# Patient Record
Sex: Female | Born: 1995 | Race: Black or African American | Hispanic: No | Marital: Single | Smoking: Current some day smoker
Health system: Southern US, Community
[De-identification: ages and names within clinical notes are randomized; demographics above are authoritative.]

---

## 2016-04-03 ENCOUNTER — Encounter: Payer: Self-pay | Admitting: Emergency Medicine

## 2016-04-03 ENCOUNTER — Emergency Department
Admission: EM | Admit: 2016-04-03 | Discharge: 2016-04-03 | Disposition: A | Discharge status: Home / Self Care | Payer: Self-pay | Attending: Emergency Medicine | Admitting: Emergency Medicine

## 2016-04-03 ENCOUNTER — Emergency Department: Payer: Self-pay

## 2016-04-03 DIAGNOSIS — F17200 Nicotine dependence, unspecified, uncomplicated: Secondary | ICD-10-CM | POA: Insufficient documentation

## 2016-04-03 DIAGNOSIS — N83201 Unspecified ovarian cyst, right side: Secondary | ICD-10-CM

## 2016-04-03 DIAGNOSIS — N730 Acute parametritis and pelvic cellulitis: Secondary | ICD-10-CM

## 2016-04-03 DIAGNOSIS — R102 Pelvic and perineal pain: Secondary | ICD-10-CM

## 2016-04-03 DIAGNOSIS — N739 Female pelvic inflammatory disease, unspecified: Secondary | ICD-10-CM | POA: Insufficient documentation

## 2016-04-03 LAB — CBC
HEMATOCRIT: 39.4 % (ref 35.0–47.0)
Hemoglobin: 13.7 g/dL (ref 12.0–16.0)
MCH: 27.5 pg (ref 26.0–34.0)
MCHC: 34.7 g/dL (ref 32.0–36.0)
MCV: 79.4 fL — AB (ref 80.0–100.0)
Platelets: 219 10*3/uL (ref 150–440)
RBC: 4.96 MIL/uL (ref 3.80–5.20)
RDW: 14.2 % (ref 11.5–14.5)
WBC: 8.3 10*3/uL (ref 3.6–11.0)

## 2016-04-03 LAB — WET PREP, GENITAL
CLUE CELLS WET PREP: NONE SEEN
SPERM: NONE SEEN
Yeast Wet Prep HPF POC: NONE SEEN

## 2016-04-03 LAB — COMPREHENSIVE METABOLIC PANEL
ALBUMIN: 4 g/dL (ref 3.5–5.0)
ALT: 11 U/L — ABNORMAL LOW (ref 14–54)
ANION GAP: 3 — AB (ref 5–15)
AST: 18 U/L (ref 15–41)
Alkaline Phosphatase: 44 U/L (ref 38–126)
BILIRUBIN TOTAL: 0.4 mg/dL (ref 0.3–1.2)
BUN: 9 mg/dL (ref 6–20)
CALCIUM: 9 mg/dL (ref 8.9–10.3)
CO2: 30 mmol/L (ref 22–32)
Chloride: 105 mmol/L (ref 101–111)
Creatinine, Ser: 0.87 mg/dL (ref 0.44–1.00)
Glucose, Bld: 101 mg/dL — ABNORMAL HIGH (ref 65–99)
POTASSIUM: 4 mmol/L (ref 3.5–5.1)
Sodium: 138 mmol/L (ref 135–145)
TOTAL PROTEIN: 6.7 g/dL (ref 6.5–8.1)

## 2016-04-03 LAB — URINALYSIS, COMPLETE (UACMP) WITH MICROSCOPIC
BILIRUBIN URINE: NEGATIVE
Bacteria, UA: NONE SEEN
GLUCOSE, UA: NEGATIVE mg/dL
Hgb urine dipstick: NEGATIVE
KETONES UR: NEGATIVE mg/dL
NITRITE: NEGATIVE
PH: 6 (ref 5.0–8.0)
Protein, ur: NEGATIVE mg/dL
SPECIFIC GRAVITY, URINE: 1.014 (ref 1.005–1.030)

## 2016-04-03 LAB — CHLAMYDIA/NGC RT PCR (ARMC ONLY)
CHLAMYDIA TR: DETECTED — AB
N GONORRHOEAE: NOT DETECTED

## 2016-04-03 LAB — POCT PREGNANCY, URINE: Preg Test, Ur: NEGATIVE

## 2016-04-03 LAB — LIPASE, BLOOD: Lipase: 20 U/L (ref 11–51)

## 2016-04-03 MED ORDER — SODIUM CHLORIDE 0.9 % IV BOLUS (SEPSIS)
1000.0000 mL | Freq: Once | INTRAVENOUS | Status: AC
  Administered 2016-04-03: 1000 mL via INTRAVENOUS

## 2016-04-03 MED ORDER — ONDANSETRON HCL 4 MG/2ML IJ SOLN
INTRAMUSCULAR | Status: AC
  Filled 2016-04-03: qty 2

## 2016-04-03 MED ORDER — CEFTRIAXONE SODIUM 250 MG IJ SOLR
250.0000 mg | Freq: Once | INTRAMUSCULAR | Status: AC
  Administered 2016-04-03: 250 mg via INTRAMUSCULAR

## 2016-04-03 MED ORDER — DOXYCYCLINE HYCLATE 100 MG PO TABS
100.0000 mg | ORAL_TABLET | Freq: Once | ORAL | Status: AC
  Administered 2016-04-03: 100 mg via ORAL
  Filled 2016-04-03: qty 1

## 2016-04-03 MED ORDER — METRONIDAZOLE 500 MG PO TABS
ORAL_TABLET | ORAL | Status: AC
  Administered 2016-04-03: 500 mg via ORAL
  Filled 2016-04-03: qty 1

## 2016-04-03 MED ORDER — DOXYCYCLINE HYCLATE 100 MG PO TABS: 100.0000 mg | ORAL_TABLET | Freq: Two times a day (BID) | ORAL | 0 refills | Status: AC

## 2016-04-03 MED ORDER — ONDANSETRON HCL 4 MG/2ML IJ SOLN
4.0000 mg | Freq: Once | INTRAMUSCULAR | Status: AC
  Administered 2016-04-03: 4 mg via INTRAVENOUS

## 2016-04-03 MED ORDER — MORPHINE SULFATE (PF) 4 MG/ML IV SOLN
INTRAVENOUS | Status: AC
  Filled 2016-04-03: qty 1

## 2016-04-03 MED ORDER — HYDROCODONE-ACETAMINOPHEN 5-325 MG PO TABS: 1.0000 | ORAL_TABLET | ORAL | 0 refills | Status: AC | PRN

## 2016-04-03 MED ORDER — DOXYCYCLINE HYCLATE 100 MG PO TABS
ORAL_TABLET | ORAL | Status: AC
  Administered 2016-04-03: 100 mg via ORAL
  Filled 2016-04-03: qty 1

## 2016-04-03 MED ORDER — METRONIDAZOLE 500 MG PO TABS
500.0000 mg | ORAL_TABLET | Freq: Once | ORAL | Status: AC
  Administered 2016-04-03: 500 mg via ORAL

## 2016-04-03 MED ORDER — METRONIDAZOLE 500 MG PO TABS: 500.0000 mg | ORAL_TABLET | Freq: Two times a day (BID) | ORAL | 0 refills | Status: AC

## 2016-04-03 MED ORDER — CEFTRIAXONE SODIUM 250 MG IJ SOLR
INTRAMUSCULAR | Status: AC
  Administered 2016-04-03: 250 mg via INTRAMUSCULAR
  Filled 2016-04-03: qty 250

## 2016-04-03 MED ORDER — MORPHINE SULFATE (PF) 4 MG/ML IV SOLN
4.0000 mg | Freq: Once | INTRAVENOUS | Status: AC
  Administered 2016-04-03: 4 mg via INTRAVENOUS

## 2016-04-03 NOTE — ED Triage Notes (Signed)
Pt reports pelvic pain on the right side, denies any vag bleeding.

## 2016-04-03 NOTE — ED Provider Notes (Signed)
[facility] Emergency Department Provider Note  Time seen: 9:17 PM  I have reviewed the triage vital signs and the nursing notes.   HISTORY  Chief Complaint Pelvic Pain    HPI Sonya Ortega is a 20 y.o. female presents to the emergency department for 2-3 days of right pelvic pain/right lower quadrant pain. Patient states for the past 2-3 days she has been having intermittent pain now more constant. Describes as moderate at 7/10 pain. Denies vaginal bleeding or discharge. States her last period was November 7. Denies nausea, vomiting, diarrhea. Denies fever.  History reviewed. No pertinent past medical history.  There are no active problems to display for this patient.   History reviewed. No pertinent surgical history.  Prior to Admission medications   Not on File    No Known Allergies  No family history on file.  Social History Social History  Substance Use Topics  . Smoking status: Current Some Day Smoker  . Smokeless tobacco: Never Used  . Alcohol use No    Review of Systems Constitutional: Negative for fever. Cardiovascular: Negative for chest pain. Respiratory: Negative for shortness of breath. Gastrointestinal:Positive for right lower quadrant/right pelvic pain. Genitourinary: Negative for dysuria. Negative for vaginal bleeding or discharge. Neurological: Negative for headache 10-point ROS otherwise negative.  ____________________________________________   PHYSICAL EXAM:  VITAL SIGNS: ED Triage Vitals  Enc Vitals Group     BP 04/03/16 1809 121/69     Pulse Rate 04/03/16 1809 (!) 102     Resp 04/03/16 1809 15     Temp 04/03/16 1809 99.1 F (37.3 C)     Temp Source 04/03/16 1809 Oral     SpO2 04/03/16 1809 97 %     Weight 04/03/16 1758 140 lb (63.5 kg)     Height 04/03/16 1758 5\' 5"  (1.651 m)     Head Circumference --      Peak Flow --      Pain Score 04/03/16 1758 8     Pain Loc --      Pain Edu? --      Excl. in GC? --      Constitutional: Alert and oriented. Well appearing and in no distress. Eyes: Normal exam ENT   Head: Normocephalic and atraumatic   Mouth/Throat: Mucous membranes are moist. Cardiovascular: Normal rate, regular rhythm. No murmurs, rubs, or gallops. Respiratory: Normal respiratory effort without tachypnea nor retractions. Breath sounds are clear Gastrointestinal: Soft, moderate right lower quadrant/right pelvic tenderness to palpation. No rebound or guarding. No distention. Musculoskeletal: Nontender with normal range of motion in all extremities.  Neurologic:  Normal speech and language. No gross focal neurologic deficits  Skin:  Skin is warm, dry and intact.  Psychiatric: Mood and affect are normal.   ____________________________________________     RADIOLOGY  Ultrasound negative for torsion, possible right hemorrhagic cyst.  ____________________________________________   INITIAL IMPRESSION / ASSESSMENT AND PLAN / ED COURSE  Pertinent labs & imaging results that were available during my care of the patient were reviewed by me and considered in my medical decision making (see chart for details).  Patient presents for right lower quadrant/right pelvic pain. Labs are positive for trichomoniasis. Otherwise largely within normal limits. Urine (negative. Pelvic examination shows a mild amount of vaginal discharge with significant right adnexal tenderness to palpation. Mild cervical motion tenderness to palpation. Ultrasound positive for right-sided hemorrhagic cyst. I suspect the patient's pain is likely due to a right hemorrhagic cyst however given the patient's adnexal tenderness  with mild cervical motion tenderness and trichomoniasis positive wet prep we will cover with doxycycline and Rocephin for PID as well as Flagyl for trichomoniasis.  ____________________________________________   FINAL CLINICAL IMPRESSION(S) / ED DIAGNOSES  Pelvic inflammatory disease Right  hemorrhagic cyst    Minna AntisKevin Neoma Uhrich, MD 04/03/16 2121

## 2016-04-03 NOTE — Discharge Instructions (Signed)
Please take your antibiotics as prescribed and her entire course. Please take pain medication as needed, as written. Please follow-up your primary care doctor in 2-3 days for recheck/reevaluation. Return to the emergency department for any acute worsening of pain, vomiting unable to keep down her antibiotics, or any other symptom personally concerning to yourself.

## 2016-04-04 ENCOUNTER — Telehealth: Payer: Self-pay | Admitting: Emergency Medicine

## 2016-04-04 NOTE — Telephone Encounter (Signed)
Called patient to inform of positive chlamydia test. She was treated in the ED, but I wanted to explain partner treatment and free std clinic at [facility].  The number did not work.  Will send letter.

## 2016-04-18 NOTE — Telephone Encounter (Signed)
Patient called me back I response to letter.  I told her that she was treated, but if she has been exposed since then, she should go to [facility] std clinic to be treated again.

## 2018-07-12 IMAGING — US US PELVIS COMPLETE
1 series · 13 of 25 positions shown · non-contrast
Comparison: None.

CLINICAL DATA: Right pelvic pain for 1 day

EXAM:
TRANSABDOMINAL AND TRANSVAGINAL ULTRASOUND OF PELVIS
DOPPLER ULTRASOUND OF OVARIES
TECHNIQUE: Both transabdominal and transvaginal ultrasound examinations of the
pelvis were performed. Transabdominal technique was performed for
global imaging of the pelvis including uterus, ovaries, adnexal
regions, and pelvic cul-de-sac.
It was necessary to proceed with endovaginal exam following the
transabdominal exam to visualize the right ovary. Color and duplex
Doppler ultrasound was utilized to evaluate blood flow to the
ovaries.

[Series 1: us pelvis complete · 0.22mm/px · 13 of 96 slices shown]
[im 1/96]
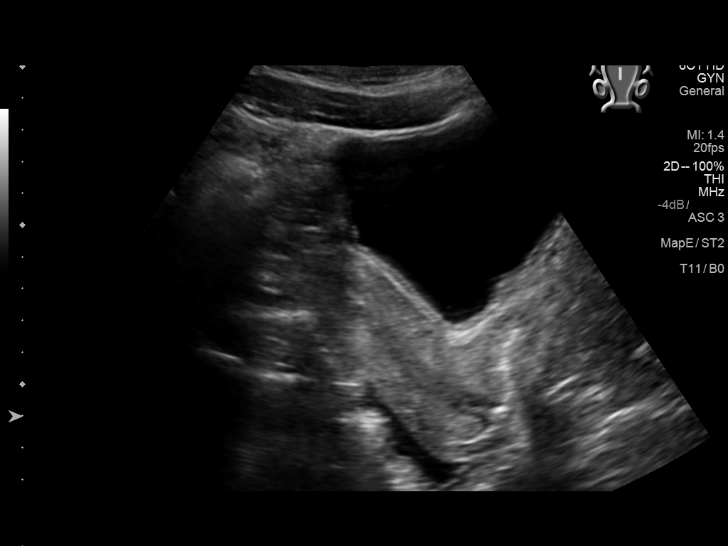
[im 8/96]
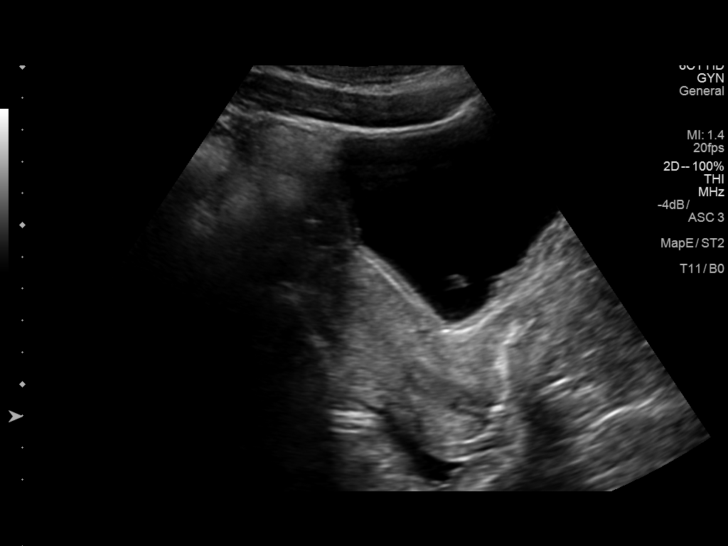
[im 16/96]
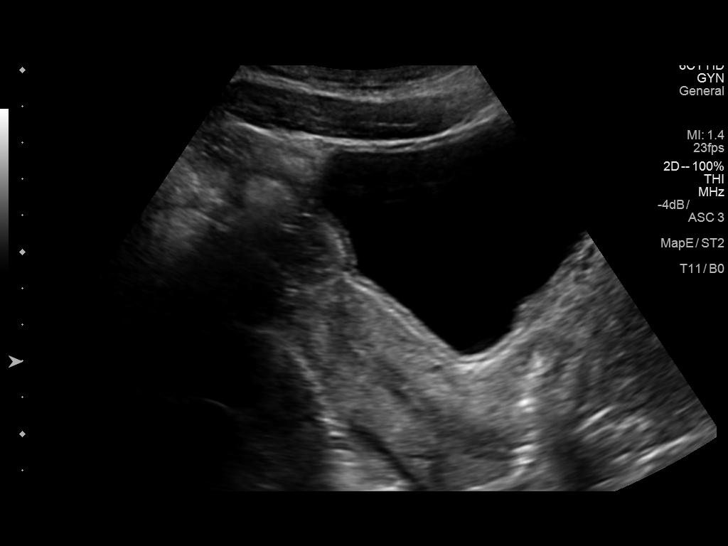
[im 24/96]
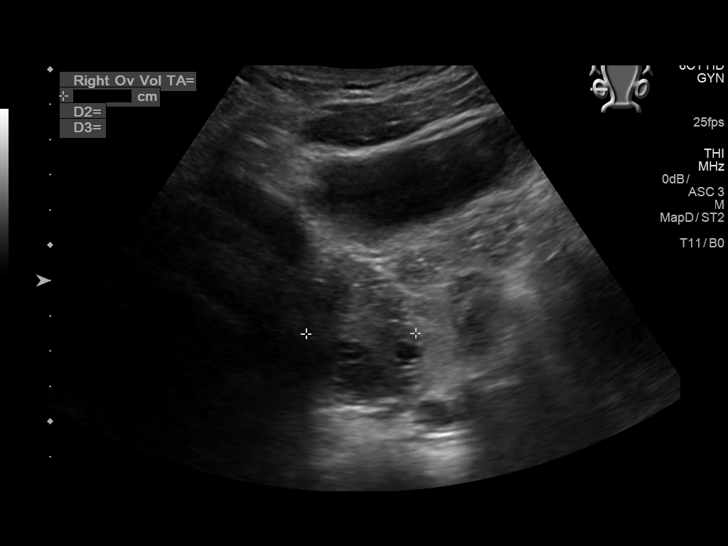
[im 32/96]
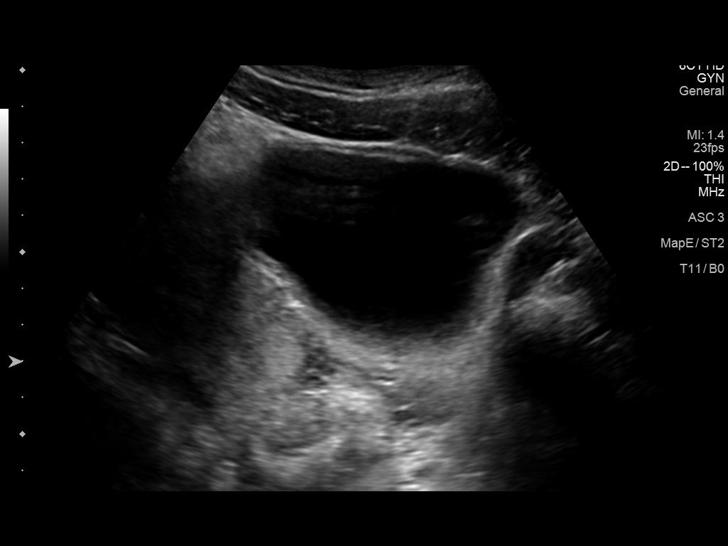
[im 40/96]
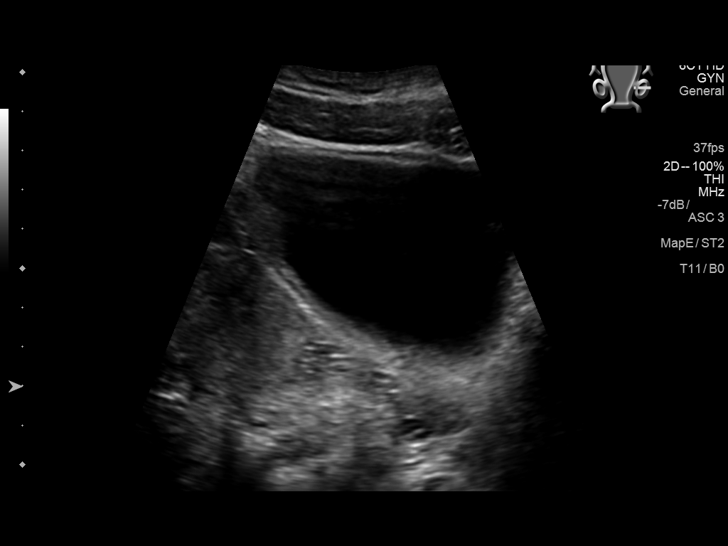
[im 48/96]
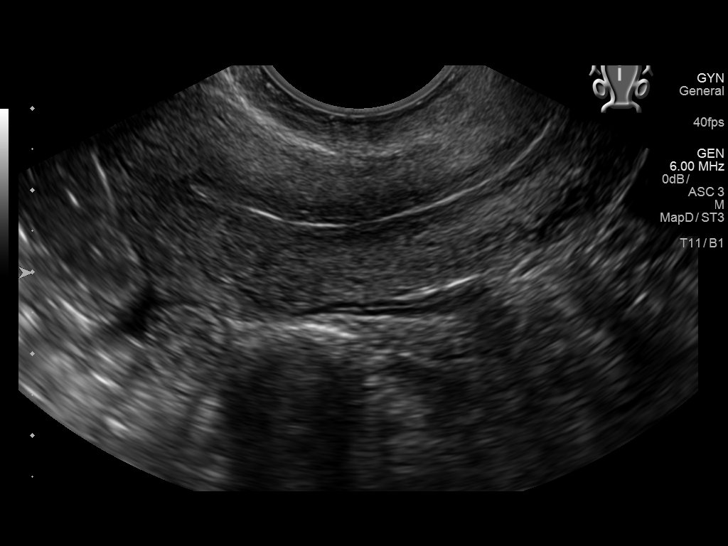
[im 56/96]
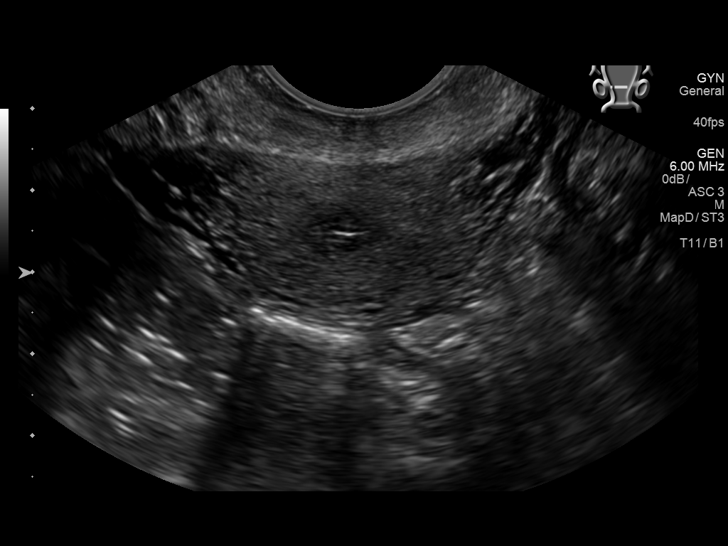
[im 64/96]
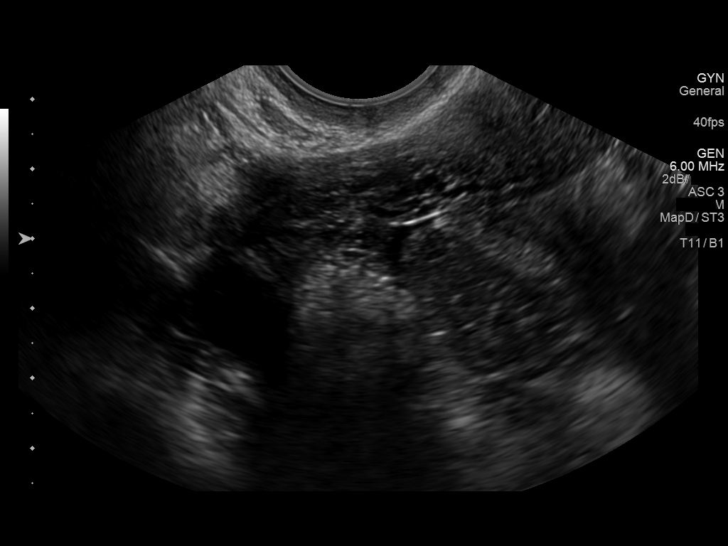
[im 72/96]
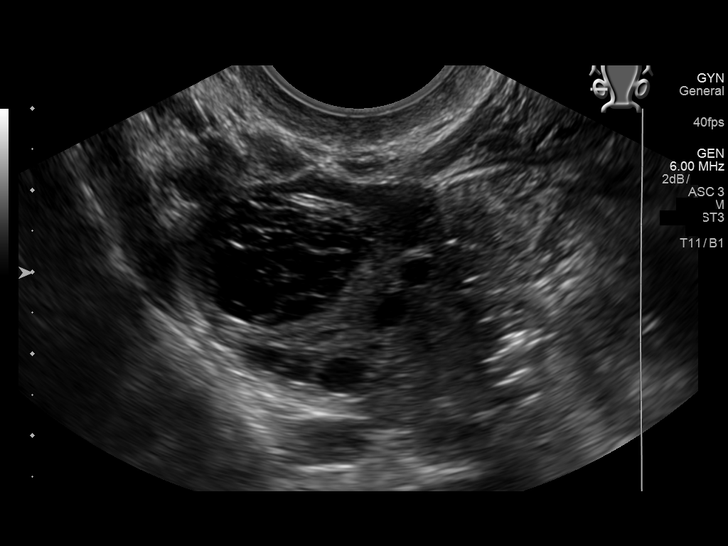
[im 80/96]
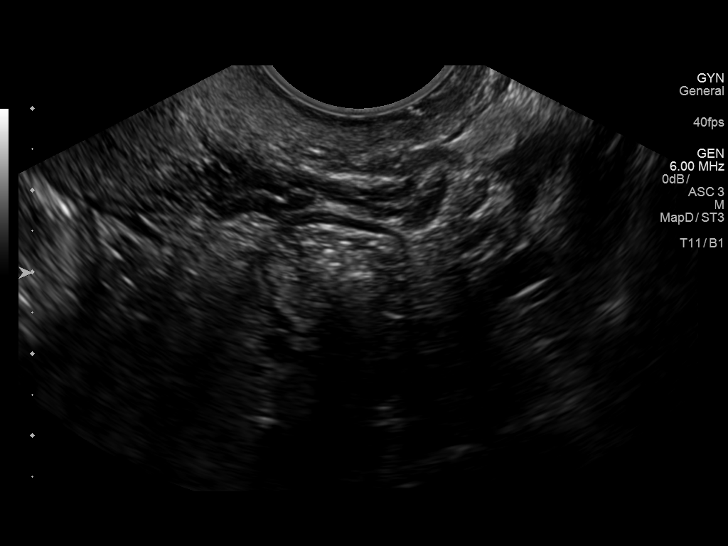
[im 88/96]
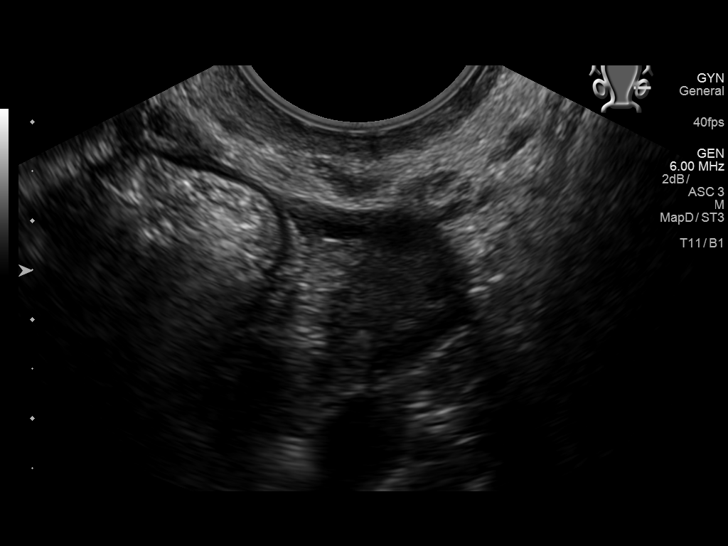
[im 96/96]
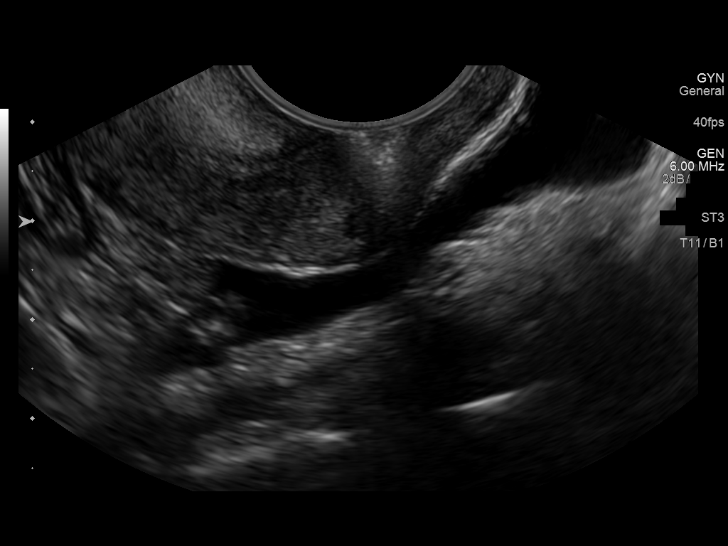

[13 of 25 positions shown; findings below may reference images not displayed]

FINDINGS: Uterus

Measurements: 5.9 x 2.6 x 4 cm. No fibroids or other mass
visualized.

Endometrium

Thickness: 4.7 mm.  No focal abnormality visualized.

Right ovary

Measurements: 3.9 x 2.5 x 3.1 cm. 2.1 x 1.4 x 2.3 cm complicated
appearing cyst right ovary, possibly hemorrhagic.

Left ovary

Measurements: 2.8 x 1.9 x 1.9 cm. Normal appearance/no adnexal mass.

Pulsed Doppler evaluation of both ovaries demonstrates normal
low-resistance arterial and venous waveforms.

Other findings

Small to moderate anechoic free fluid in the pelvic cul-de-sac.
IMPRESSION: 1. No sonographic evidence for ovarian torsion
2. 2.3 cm complicated appearing cyst right ovary, possible
hemorrhagic cyst. Small to moderate anechoic free fluid in the
pelvis.
